# Patient Record
Sex: Female | Born: 1975 | Race: White | Hispanic: No | Marital: Married | Smoking: Never smoker
Health system: Southern US, Community
[De-identification: ages and names within clinical notes are randomized; demographics above are authoritative.]

## PROBLEM LIST (undated history)

## (undated) DIAGNOSIS — E282 Polycystic ovarian syndrome: Secondary | ICD-10-CM

## (undated) DIAGNOSIS — J45909 Unspecified asthma, uncomplicated: Secondary | ICD-10-CM

## (undated) HISTORY — PX: LAPAROSCOPIC GASTRIC RESTRICTIVE DUODENAL PROCEDURE (DUODENAL SWITCH): SHX6667

---

## 1994-05-24 DIAGNOSIS — O0281 Inappropriate change in quantitative human chorionic gonadotropin (hCG) in early pregnancy: Secondary | ICD-10-CM

## 1995-05-25 DIAGNOSIS — O0281 Inappropriate change in quantitative human chorionic gonadotropin (hCG) in early pregnancy: Secondary | ICD-10-CM

## 2006-11-30 ENCOUNTER — Emergency Department: Payer: Self-pay | Admitting: Emergency Medicine

## 2007-02-13 ENCOUNTER — Ambulatory Visit: Payer: Self-pay | Admitting: Emergency Medicine

## 2007-02-13 ENCOUNTER — Other Ambulatory Visit: Payer: Self-pay

## 2007-12-26 ENCOUNTER — Ambulatory Visit: Payer: Self-pay | Admitting: Internal Medicine

## 2007-12-28 ENCOUNTER — Ambulatory Visit: Payer: Self-pay | Admitting: Internal Medicine

## 2007-12-30 ENCOUNTER — Ambulatory Visit: Payer: Self-pay | Admitting: Internal Medicine

## 2008-01-01 ENCOUNTER — Ambulatory Visit: Payer: Self-pay | Admitting: Internal Medicine

## 2008-01-04 ENCOUNTER — Ambulatory Visit: Payer: Self-pay | Admitting: Internal Medicine

## 2008-01-11 ENCOUNTER — Ambulatory Visit: Payer: Self-pay | Admitting: Family Medicine

## 2008-01-12 ENCOUNTER — Ambulatory Visit: Payer: Self-pay | Admitting: Family Medicine

## 2008-05-26 ENCOUNTER — Ambulatory Visit: Payer: Self-pay | Admitting: Family Medicine

## 2009-07-11 ENCOUNTER — Ambulatory Visit: Payer: Self-pay | Admitting: Internal Medicine

## 2012-05-16 ENCOUNTER — Emergency Department: Payer: Self-pay | Admitting: Internal Medicine

## 2013-06-02 ENCOUNTER — Ambulatory Visit: Payer: Self-pay | Admitting: Family Medicine

## 2013-06-03 LAB — RAPID INFLUENZA A&B ANTIGENS

## 2013-12-21 ENCOUNTER — Ambulatory Visit: Payer: Self-pay | Admitting: Gastroenterology

## 2014-01-09 ENCOUNTER — Ambulatory Visit: Payer: Self-pay | Admitting: Family Medicine

## 2014-05-20 ENCOUNTER — Ambulatory Visit: Payer: Self-pay | Admitting: Gastroenterology

## 2015-05-22 ENCOUNTER — Other Ambulatory Visit: Payer: Self-pay | Admitting: Obstetrics and Gynecology

## 2015-05-22 DIAGNOSIS — Z1231 Encounter for screening mammogram for malignant neoplasm of breast: Secondary | ICD-10-CM

## 2015-05-24 ENCOUNTER — Inpatient Hospital Stay
Admission: EM | Admit: 2015-05-24 | Discharge: 2015-05-26 | DRG: 202 | Disposition: A | Discharge status: Home / Self Care | Payer: BLUE CROSS/BLUE SHIELD | Attending: Internal Medicine | Admitting: Internal Medicine

## 2015-05-24 DIAGNOSIS — R0600 Dyspnea, unspecified: Secondary | ICD-10-CM | POA: Diagnosis present

## 2015-05-24 DIAGNOSIS — Z833 Family history of diabetes mellitus: Secondary | ICD-10-CM

## 2015-05-24 DIAGNOSIS — T50995A Adverse effect of other drugs, medicaments and biological substances, initial encounter: Secondary | ICD-10-CM | POA: Diagnosis present

## 2015-05-24 DIAGNOSIS — J069 Acute upper respiratory infection, unspecified: Secondary | ICD-10-CM | POA: Diagnosis present

## 2015-05-24 DIAGNOSIS — Z882 Allergy status to sulfonamides status: Secondary | ICD-10-CM

## 2015-05-24 DIAGNOSIS — Z9884 Bariatric surgery status: Secondary | ICD-10-CM

## 2015-05-24 DIAGNOSIS — R000 Tachycardia, unspecified: Secondary | ICD-10-CM | POA: Diagnosis present

## 2015-05-24 DIAGNOSIS — R0781 Pleurodynia: Secondary | ICD-10-CM

## 2015-05-24 DIAGNOSIS — Z8249 Family history of ischemic heart disease and other diseases of the circulatory system: Secondary | ICD-10-CM

## 2015-05-24 DIAGNOSIS — R55 Syncope and collapse: Secondary | ICD-10-CM | POA: Diagnosis present

## 2015-05-24 DIAGNOSIS — J4521 Mild intermittent asthma with (acute) exacerbation: Secondary | ICD-10-CM | POA: Diagnosis present

## 2015-05-24 DIAGNOSIS — J45901 Unspecified asthma with (acute) exacerbation: Secondary | ICD-10-CM

## 2015-05-24 DIAGNOSIS — J209 Acute bronchitis, unspecified: Principal | ICD-10-CM | POA: Diagnosis present

## 2015-05-24 DIAGNOSIS — B349 Viral infection, unspecified: Secondary | ICD-10-CM

## 2015-05-24 NOTE — ED Notes (Signed)
Pt presents with c/o SOB x 1 hour.  She states that she has had a productive cough and fever for the past day (Tmax 103).  But an hour ago she developed chest pressure and difficulty breathing.

## 2015-05-25 ENCOUNTER — Emergency Department: Payer: BLUE CROSS/BLUE SHIELD

## 2015-05-25 ENCOUNTER — Inpatient Hospital Stay
Admit: 2015-05-25 | Discharge: 2015-05-25 | Disposition: A | Discharge status: Home / Self Care | Payer: BLUE CROSS/BLUE SHIELD | Attending: Internal Medicine | Admitting: Internal Medicine

## 2015-05-25 DIAGNOSIS — R55 Syncope and collapse: Secondary | ICD-10-CM | POA: Diagnosis present

## 2015-05-25 DIAGNOSIS — J069 Acute upper respiratory infection, unspecified: Secondary | ICD-10-CM | POA: Diagnosis present

## 2015-05-25 DIAGNOSIS — J209 Acute bronchitis, unspecified: Secondary | ICD-10-CM | POA: Diagnosis present

## 2015-05-25 DIAGNOSIS — Z882 Allergy status to sulfonamides status: Secondary | ICD-10-CM | POA: Diagnosis not present

## 2015-05-25 DIAGNOSIS — Z9884 Bariatric surgery status: Secondary | ICD-10-CM | POA: Diagnosis not present

## 2015-05-25 DIAGNOSIS — R000 Tachycardia, unspecified: Secondary | ICD-10-CM | POA: Diagnosis present

## 2015-05-25 DIAGNOSIS — R0600 Dyspnea, unspecified: Secondary | ICD-10-CM | POA: Diagnosis present

## 2015-05-25 DIAGNOSIS — J4521 Mild intermittent asthma with (acute) exacerbation: Secondary | ICD-10-CM | POA: Diagnosis present

## 2015-05-25 DIAGNOSIS — Z833 Family history of diabetes mellitus: Secondary | ICD-10-CM | POA: Diagnosis not present

## 2015-05-25 DIAGNOSIS — Z8249 Family history of ischemic heart disease and other diseases of the circulatory system: Secondary | ICD-10-CM | POA: Diagnosis not present

## 2015-05-25 DIAGNOSIS — T50995A Adverse effect of other drugs, medicaments and biological substances, initial encounter: Secondary | ICD-10-CM | POA: Diagnosis present

## 2015-05-25 DIAGNOSIS — R0781 Pleurodynia: Secondary | ICD-10-CM | POA: Diagnosis present

## 2015-05-25 LAB — COMPREHENSIVE METABOLIC PANEL
ALT: 23 U/L (ref 14–54)
AST: 26 U/L (ref 15–41)
Albumin: 4 g/dL (ref 3.5–5.0)
Alkaline Phosphatase: 107 U/L (ref 38–126)
Anion gap: 10 (ref 5–15)
BILIRUBIN TOTAL: 0.7 mg/dL (ref 0.3–1.2)
BUN: 8 mg/dL (ref 6–20)
CALCIUM: 9.1 mg/dL (ref 8.9–10.3)
CHLORIDE: 106 mmol/L (ref 101–111)
CO2: 26 mmol/L (ref 22–32)
CREATININE: 0.75 mg/dL (ref 0.44–1.00)
Glucose, Bld: 100 mg/dL — ABNORMAL HIGH (ref 65–99)
Potassium: 3.6 mmol/L (ref 3.5–5.1)
Sodium: 142 mmol/L (ref 135–145)
Total Protein: 7.1 g/dL (ref 6.5–8.1)

## 2015-05-25 LAB — CBC
HEMATOCRIT: 43.7 % (ref 35.0–47.0)
Hemoglobin: 14.9 g/dL (ref 12.0–16.0)
MCH: 30.4 pg (ref 26.0–34.0)
MCHC: 34.2 g/dL (ref 32.0–36.0)
MCV: 88.9 fL (ref 80.0–100.0)
Platelets: 208 10*3/uL (ref 150–440)
RBC: 4.91 MIL/uL (ref 3.80–5.20)
RDW: 13.9 % (ref 11.5–14.5)
WBC: 5.8 10*3/uL (ref 3.6–11.0)

## 2015-05-25 LAB — URINALYSIS COMPLETE WITH MICROSCOPIC (ARMC ONLY)
Bilirubin Urine: NEGATIVE
Glucose, UA: NEGATIVE mg/dL
Hgb urine dipstick: NEGATIVE
Nitrite: NEGATIVE
PROTEIN: NEGATIVE mg/dL
Specific Gravity, Urine: 1.017 (ref 1.005–1.030)
pH: 6 (ref 5.0–8.0)

## 2015-05-25 LAB — RAPID INFLUENZA A&B ANTIGENS
Influenza A (ARMC): NEGATIVE
Influenza B (ARMC): NEGATIVE

## 2015-05-25 LAB — POCT PREGNANCY, URINE: PREG TEST UR: NEGATIVE

## 2015-05-25 LAB — TROPONIN I: Troponin I: <0.03 ng/mL (ref <0.031)

## 2015-05-25 LAB — TSH: TSH: 2.067 u[IU]/mL (ref 0.350–4.500)

## 2015-05-25 LAB — BRAIN NATRIURETIC PEPTIDE: B Natriuretic Peptide: 18 pg/mL (ref 0.0–100.0)

## 2015-05-25 LAB — FIBRIN DERIVATIVES D-DIMER (ARMC ONLY): FIBRIN DERIVATIVES D-DIMER (ARMC): 561 — AB (ref 0–499)

## 2015-05-25 MED ORDER — PANTOPRAZOLE SODIUM 40 MG PO TBEC
40.0000 mg | DELAYED_RELEASE_TABLET | Freq: Every day | ORAL | Status: DC
  Administered 2015-05-25 – 2015-05-26 (×2): 40 mg via ORAL
  Filled 2015-05-25 (×2): qty 1

## 2015-05-25 MED ORDER — ACETAMINOPHEN 325 MG PO TABS
650.0000 mg | ORAL_TABLET | Freq: Four times a day (QID) | ORAL | Status: DC | PRN
  Administered 2015-05-25: 650 mg via ORAL
  Filled 2015-05-25: qty 2

## 2015-05-25 MED ORDER — VITAMIN D 1000 UNITS PO TABS
4000.0000 [IU] | ORAL_TABLET | Freq: Every day | ORAL | Status: DC
  Filled 2015-05-25: qty 4

## 2015-05-25 MED ORDER — SENNOSIDES-DOCUSATE SODIUM 8.6-50 MG PO TABS: 1.0000 | ORAL_TABLET | Freq: Every evening | ORAL | Status: DC | PRN

## 2015-05-25 MED ORDER — HYDROCODONE-ACETAMINOPHEN 5-325 MG PO TABS: 1.0000 | ORAL_TABLET | ORAL | Status: DC | PRN

## 2015-05-25 MED ORDER — B-12 2500 MCG PO TABS
2500.0000 ug | ORAL_TABLET | Freq: Every day | ORAL | Status: DC
  Filled 2015-05-25: qty 1

## 2015-05-25 MED ORDER — CALCIUM CITRATE 950 (200 CA) MG PO TABS
1500.0000 mg | ORAL_TABLET | Freq: Every day | ORAL | Status: DC
  Filled 2015-05-25: qty 2

## 2015-05-25 MED ORDER — CHOLECALCIFEROL 25 MCG (1000 UT) PO TABS
6000.0000 [IU] | ORAL_TABLET | Freq: Every day | ORAL | Status: DC
  Filled 2015-05-25: qty 6

## 2015-05-25 MED ORDER — ACETAMINOPHEN 500 MG PO TABS
1000.0000 mg | ORAL_TABLET | Freq: Once | ORAL | Status: AC
  Administered 2015-05-25: 1000 mg via ORAL

## 2015-05-25 MED ORDER — ALUM & MAG HYDROXIDE-SIMETH 200-200-20 MG/5ML PO SUSP: 30.0000 mL | Freq: Four times a day (QID) | ORAL | Status: DC | PRN

## 2015-05-25 MED ORDER — AZITHROMYCIN 250 MG PO TABS: 250.0000 mg | ORAL_TABLET | Freq: Every day | ORAL | Status: DC

## 2015-05-25 MED ORDER — PHYTONADIONE 5 MG PO TABS
5.0000 mg | ORAL_TABLET | Freq: Every day | ORAL | Status: DC
  Filled 2015-05-25: qty 1

## 2015-05-25 MED ORDER — FOLIC ACID 1 MG PO TABS
1.0000 mg | ORAL_TABLET | Freq: Every day | ORAL | Status: DC
  Administered 2015-05-25: 1 mg via ORAL
  Filled 2015-05-25: qty 1

## 2015-05-25 MED ORDER — CALCIUM CITRATE-VITAMIN D 500-400 MG-UNIT PO CHEW
1.0000 | CHEWABLE_TABLET | Freq: Three times a day (TID) | ORAL | Status: DC
  Administered 2015-05-25: 1 via ORAL
  Filled 2015-05-25 (×4): qty 1

## 2015-05-25 MED ORDER — ALBUTEROL SULFATE (2.5 MG/3ML) 0.083% IN NEBU: 3.0000 mL | INHALATION_SOLUTION | Freq: Four times a day (QID) | RESPIRATORY_TRACT | Status: DC | PRN

## 2015-05-25 MED ORDER — ACETAMINOPHEN 650 MG RE SUPP: 650.0000 mg | Freq: Four times a day (QID) | RECTAL | Status: DC | PRN

## 2015-05-25 MED ORDER — IPRATROPIUM-ALBUTEROL 0.5-2.5 (3) MG/3ML IN SOLN
RESPIRATORY_TRACT | Status: AC
  Administered 2015-05-25: 3 mL via RESPIRATORY_TRACT
  Filled 2015-05-25: qty 6

## 2015-05-25 MED ORDER — ALBUTEROL SULFATE (2.5 MG/3ML) 0.083% IN NEBU: 5.0000 mg | INHALATION_SOLUTION | Freq: Once | RESPIRATORY_TRACT | Status: DC

## 2015-05-25 MED ORDER — ONDANSETRON HCL 4 MG/2ML IJ SOLN
4.0000 mg | Freq: Once | INTRAMUSCULAR | Status: AC
  Administered 2015-05-25: 4 mg via INTRAVENOUS
  Filled 2015-05-25: qty 2

## 2015-05-25 MED ORDER — PREDNISONE 20 MG PO TABS
40.0000 mg | ORAL_TABLET | Freq: Once | ORAL | Status: AC
  Administered 2015-05-25: 40 mg via ORAL
  Filled 2015-05-25: qty 2

## 2015-05-25 MED ORDER — ONDANSETRON HCL 4 MG PO TABS
4.0000 mg | ORAL_TABLET | Freq: Four times a day (QID) | ORAL | Status: DC | PRN
Start: 2015-05-25 — End: 2015-05-26

## 2015-05-25 MED ORDER — IPRATROPIUM-ALBUTEROL 0.5-2.5 (3) MG/3ML IN SOLN
3.0000 mL | Freq: Once | RESPIRATORY_TRACT | Status: AC
  Administered 2015-05-25: 3 mL via RESPIRATORY_TRACT
  Filled 2015-05-25: qty 3

## 2015-05-25 MED ORDER — ACETAMINOPHEN 500 MG PO TABS
ORAL_TABLET | ORAL | Status: AC
  Filled 2015-05-25: qty 2

## 2015-05-25 MED ORDER — IOHEXOL 350 MG/ML SOLN
75.0000 mL | Freq: Once | INTRAVENOUS | Status: AC | PRN
  Administered 2015-05-25: 75 mL via INTRAVENOUS

## 2015-05-25 MED ORDER — METHYLPREDNISOLONE SODIUM SUCC 40 MG IJ SOLR
40.0000 mg | Freq: Two times a day (BID) | INTRAMUSCULAR | Status: DC
  Administered 2015-05-25 – 2015-05-26 (×3): 40 mg via INTRAVENOUS
  Filled 2015-05-25 (×3): qty 1

## 2015-05-25 MED ORDER — ONDANSETRON HCL 4 MG/2ML IJ SOLN: 4.0000 mg | Freq: Four times a day (QID) | INTRAMUSCULAR | Status: DC | PRN

## 2015-05-25 MED ORDER — AZITHROMYCIN 250 MG PO TABS: 500.0000 mg | ORAL_TABLET | Freq: Once | ORAL | Status: DC

## 2015-05-25 MED ORDER — SODIUM CHLORIDE 0.9 % IV SOLN
INTRAVENOUS | Status: DC
  Administered 2015-05-25 – 2015-05-26 (×3): via INTRAVENOUS

## 2015-05-25 MED ORDER — SODIUM CHLORIDE 0.9 % IJ SOLN
3.0000 mL | Freq: Two times a day (BID) | INTRAMUSCULAR | Status: DC
  Administered 2015-05-25: 3 mL via INTRAVENOUS

## 2015-05-25 MED ORDER — VITAMIN B-12 1000 MCG PO TABS
2000.0000 ug | ORAL_TABLET | Freq: Every day | ORAL | Status: DC
  Administered 2015-05-25: 2000 ug via ORAL
  Filled 2015-05-25: qty 2

## 2015-05-25 MED ORDER — IPRATROPIUM-ALBUTEROL 0.5-2.5 (3) MG/3ML IN SOLN
3.0000 mL | Freq: Once | RESPIRATORY_TRACT | Status: AC
  Administered 2015-05-25: 3 mL via RESPIRATORY_TRACT

## 2015-05-25 MED ORDER — BENZONATATE 100 MG PO CAPS
ORAL_CAPSULE | ORAL | Status: AC
  Filled 2015-05-25: qty 1

## 2015-05-25 MED ORDER — ENOXAPARIN SODIUM 40 MG/0.4ML ~~LOC~~ SOLN
40.0000 mg | Freq: Two times a day (BID) | SUBCUTANEOUS | Status: DC
  Administered 2015-05-25: 40 mg via SUBCUTANEOUS
  Filled 2015-05-25 (×2): qty 0.4

## 2015-05-25 MED ORDER — ADULT MULTIVITAMIN W/MINERALS CH
2.0000 | ORAL_TABLET | Freq: Every day | ORAL | Status: DC
  Administered 2015-05-25: 2 via ORAL
  Filled 2015-05-25: qty 2

## 2015-05-25 MED ORDER — BENZONATATE 100 MG PO CAPS
100.0000 mg | ORAL_CAPSULE | ORAL | Status: AC
  Administered 2015-05-25: 100 mg via ORAL
  Filled 2015-05-25: qty 1

## 2015-05-25 MED ORDER — AZITHROMYCIN 250 MG PO TABS
500.0000 mg | ORAL_TABLET | Freq: Once | ORAL | Status: AC
  Administered 2015-05-25: 500 mg via ORAL
  Filled 2015-05-25: qty 2

## 2015-05-25 MED ORDER — CALCIUM CITRATE-VITAMIN D 500-400 MG-UNIT PO CHEW
3.0000 | CHEWABLE_TABLET | Freq: Every day | ORAL | Status: DC
Start: 2015-05-25 — End: 2015-05-25
  Administered 2015-05-25: 1 via ORAL
  Filled 2015-05-25: qty 3

## 2015-05-25 MED ORDER — ACETAMINOPHEN 325 MG PO TABS: 650.0000 mg | ORAL_TABLET | Freq: Once | ORAL | Status: DC

## 2015-05-25 MED ORDER — MORPHINE SULFATE (PF) 4 MG/ML IV SOLN
4.0000 mg | Freq: Once | INTRAVENOUS | Status: AC
  Administered 2015-05-25: 4 mg via INTRAVENOUS
  Filled 2015-05-25: qty 1

## 2015-05-25 MED ORDER — VITAMIN A 10000 UNITS PO CAPS: 10000.0000 [IU] | ORAL_CAPSULE | Freq: Every day | ORAL | Status: DC

## 2015-05-25 MED ORDER — SODIUM CHLORIDE 0.9 % IV BOLUS (SEPSIS)
1000.0000 mL | Freq: Once | INTRAVENOUS | Status: AC
  Administered 2015-05-25: 1000 mL via INTRAVENOUS
  Filled 2015-05-25: qty 1000

## 2015-05-25 MED ORDER — PREDNISONE 20 MG PO TABS
ORAL_TABLET | ORAL | Status: AC
  Filled 2015-05-25: qty 2

## 2015-05-25 NOTE — ED Notes (Addendum)
Ambulated pt with pulse oximetry, and cardiac monitors attached. Pt's O2 sats stayed above 94%, however pt's respirations increased to 30+/min, and pt's HR increased to 130+bpm.  Pt became dizzy/lightheaded, and began to have poor gait while ambulating. MD notified.

## 2015-05-25 NOTE — Progress Notes (Signed)
*  PRELIMINARY RESULTS* Echocardiogram 2D Echocardiogram has been performed.  Angel Bowers 05/25/2015, 10:11 AM

## 2015-05-25 NOTE — ED Provider Notes (Addendum)
[facility] Emergency Department Provider Note  REMINDER - THIS NOTE IS NOT A FINAL MEDICAL RECORD UNTIL IT IS SIGNED. UNTIL THEN, THE CONTENT BELOW MAY REFLECT INFORMATION FROM A DOCUMENTATION TEMPLATE, NOT THE ACTUAL PATIENT VISIT. ____________________________________________  Time seen: Approximately 12:39 AM  I have reviewed the triage vital signs and the nursing notes.   HISTORY  Chief Complaint Shortness of Breath    HPI Angel Bowers is a 40 y.o. female history of previous bariatric surgery and asthma. She tells me that her asthma symptoms have gone away and she has not had any recent issues.  For the last day she's been having a dry, and occasionally productive cough. She reports that she felt slightly short of breath earlier today, noticing it worse feeling "congested" when she lays back. She did report having a fever to 103 today. No abdominal pain, no nausea or vomiting. She has not had any chest pain or discomfort until about one hour to prior arrival.  Patient went to bed, she was laying down and states that she suddenly woke up feeling short of breath with pain whenever she took a deep breath. She reports a sharp somewhat uncomfortable pain across the mid chest, worse with coughing and also associated with shortness of breath and feeling very anxious just about one hour prior to arrival.  She denies any heavy chest pressure, she states doesn't sound like a "heart attack".  No estrogen use. No smoking. No recent travel. No surgery in the last 3 months. No history of blood clots. Denies leg swelling.   History reviewed. No pertinent past medical history.  There are no active problems to display for this patient.   Past Surgical History  Procedure Laterality Date  . 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 Calcium Citrate 200 MG TABS   Oral    Take 200 mg by mouth daily.         . Cholecalciferol (VITAMIN D-1000 MAX ST) 1000 units tablet   Oral   Take 6,000 Units by mouth daily.         . Cyanocobalamin (B-12) 2500 MCG TABS   Oral   Take 2,500 mg by mouth daily.         . folic acid (FOLVITE) 1 MG tablet   Oral   Take 1 mg by mouth daily.         . pantoprazole (PROTONIX) 40 MG tablet   Oral   Take 40 mg by mouth daily.         . phytonadione (VITAMIN K) 5 MG tablet   Oral   Take 5 mg by mouth daily.         . vitamin A ([employer] VITAMIN A) 10000 UNIT capsule   Oral   Take 10,000 Units by mouth daily.           Allergies Sulfa antibiotics  No family history on file.  Social History Social History  Substance Use Topics  . Smoking status: Never Smoker   . Smokeless tobacco: None  . Alcohol Use: No    Review of Systems Constitutional:  see history of present illnessyes: No visual changes. ENT:  slightly scratchy, but denies sore throat or trouble swallowing. Cardiovascular:  Gershon Cull reports only having pain in his discomfort with a slight sharp component when she takes a deep breath. She denies having pain  when taking normal breast. No radiating pain. No heavy chest pressure. Associated with any sweating. No nausea or vomiting. Respiratory:  states she felt very short of breath at home, this is improved some. She denies wheezing. She reports she has a history of asthma, but this did not feel anything like that in the past. At the present time her breathing seems to be somewhat better, though she does still feel the mild shortness of breath. She reports feeling "congested". Gastrointestinal: No abdominal pain.  No nausea, no vomiting.  No diarrhea.  No constipation. Genitourinary: Negative for dysuria. Musculoskeletal: Negative for back pain. she's had some slight muscle aches for the last day.  Skin: Negative for rash. Neurological: Negative for headaches, focal weakness or numbness.  10-point  ROS otherwise negative.  ____________________________________________   PHYSICAL EXAM:  VITAL SIGNS: ED Triage Vitals  Enc Vitals Group     BP --      Pulse Rate 05/24/15 2352 121     Resp 05/24/15 2352 26     Temp 05/24/15 2352 99.5 F (37.5 C)     Temp Source 05/24/15 2352 Oral     SpO2 05/24/15 2352 96 %     Weight 05/24/15 2352 259 lb (117.482 kg)     Height 05/24/15 2352 5\' 1"  (1.549 m)     Head Cir --      Peak Flow --      Pain Score 05/24/15 2353 5     Pain Loc --      Pain Edu? --      Excl. in GC? --    Constitutional: Alert and oriented.  anxious with moderate increased work of breathing. Sitting upright in bed. Eyes: Conjunctivae are normal. PERRL. EOMI. Head: Atraumatic. Nose:  slight clear rhinorrhea. Mouth/Throat: Mucous membranes are moist.  Oropharynx non-erythematous. Neck: No stridor.   Cardiovascular: Normal rate at 90 , regular rhythm. Grossly normal heart sounds.  Good peripheral circulation. Mild tachypnea. No wheezing no retractions. She is able to speak in full sentences. No retractions. Lungs CTAB. Gastrointestinal: Soft and nontender. No distention. No abdominal bruits. No CVA tenderness. Musculoskeletal: No lower extremity tenderness nor edema.  No joint effusions. Neurologic:  Normal speech and language. No gross focal neurologic deficits are appreciated.Skin:  Skin is warm, dry and intact. No rash noted. Psychiatric: Mood and affect are normal. Speech and behavior are normal.  ____________________________________________   LABS (all labs ordered are listed, but only abnormal results are displayed)  Labs Reviewed  FIBRIN DERIVATIVES D-DIMER ([facility] ONLY) - Abnormal; Notable for the following:    Fibrin derivatives D-dimer ([facility]) 561 (*)    All other components within normal limits  COMPREHENSIVE METABOLIC PANEL - Abnormal; Notable for the following:    Glucose, Bld 100 (*)    All other components within normal limits  URINALYSIS  COMPLETEWITH MICROSCOPIC ([facility] ONLY) - Abnormal; Notable for the following:    Color, Urine YELLOW (*)    APPearance HAZY (*)    Ketones, ur TRACE (*)    Leukocytes, UA TRACE (*)    Bacteria, UA RARE (*)    Squamous Epithelial / LPF 6-30 (*)    All other components within normal limits  RAPID INFLUENZA A&B ANTIGENS ([facility] ONLY)  BRAIN NATRIURETIC PEPTIDE  TROPONIN I  CBC  TROPONIN I  POC URINE PREG, ED  POCT PREGNANCY, URINE   ____________________________________________  EKG  ED ECG REPORT I, Favour Aleshire, the attending physician, personally viewed and interpreted this ECG.  Date: 05/24/15 EKG Time: 2359 Rate: 95 Rhythm: normal sinus rhythm QRS Axis: normal Intervals: normal ST/T Wave abnormalities: normal Conduction Disutrbances: none Narrative Interpretation: There is notable artifact, but I do not see any acute abnormality. Plan to repeat once the patient breathing is calm her.  Date: 05/25/2015 EKG Time: 0030 Rate: 90 Rhythm: normal sinus rhythm QRS Axis: normal Intervals: normal ST/T Wave abnormalities: normal Conduction Disutrbances: none Narrative Interpretation: unremarkable  ____________________________________________  RADIOLOGY   CT Angio Chest PE W/Cm &/Or Wo Cm (Final result) Result time: 05/25/15 03:18:39   Final result by Rad Results In Interface (05/25/15 03:18:39)   Narrative:   CLINICAL DATA: Acute onset of shortness of breath, productive cough and fever. Chest pressure and difficulty breathing. Initial encounter.  EXAM: CT ANGIOGRAPHY CHEST WITH CONTRAST  TECHNIQUE: Multidetector CT imaging of the chest was performed using the standard protocol during bolus administration of intravenous contrast. Multiplanar CT image reconstructions and MIPs were obtained to evaluate the vascular anatomy.  CONTRAST: 75mL OMNIPAQUE IOHEXOL 350 MG/ML SOLN  COMPARISON: Chest radiograph from 05/25/2015  FINDINGS: There is no evidence of  pulmonary embolus.  The lungs are essentially clear bilaterally. There is no evidence of significant focal consolidation, pleural effusion or pneumothorax. No masses are identified; no abnormal focal contrast enhancement is seen.  The mediastinum is unremarkable in appearance. No mediastinal lymphadenopathy is seen. No pericardial effusion is identified. The great vessels are grossly unremarkable in appearance. Mild coronary artery calcification is noted, about the proximal left anterior descending artery. No axillary lymphadenopathy is seen. The visualized portions of the thyroid gland are unremarkable in appearance.  The visualized portions of the liver and spleen are unremarkable. Postoperative change is noted about the stomach. The visualized portions of the pancreas, adrenal glands and kidneys are grossly unremarkable.  No acute osseous abnormalities are seen.  Review of the MIP images confirms the above findings.  IMPRESSION: 1. No evidence of pulmonary embolus. 2. Lungs essentially clear bilaterally. 3. Mild coronary artery calcification noted, about the proximal left anterior descending artery.   Electronically Signed By: Roanna RaiderJeffery Chang M.D. On: 05/25/2015 03:18       ____________________________________________   PROCEDURES  Procedure(s) performed: None  Critical Care performed: No  ____________________________________________   INITIAL IMPRESSION / ASSESSMENT AND PLAN / ED COURSE  Pertinent labs & imaging results that were available during my care of the patient were reviewed by me and considered in my medical decision making (see chart for details).  Patient reports congestion, upper respiratory symptoms for about the last day with a fever to 103. She does report she had a flu shot this year. She has a occasionally productive cough. Her lung exam is clear, though she is tachypneic and anxious appearing. Appears likely infectious, but given her  previous history and associated somewhat pleuritic chest pain I will add a d-dimer. She is low risk by well's criteria. I do not believe pulmonary wasn't the most likely cause given her upper respiratory symptoms and would feel pleurisy, upper respiratory infection, pneumonia to be more likely than pulmonary embolus a more acute cardiac syndrome. Chest pain is very atypical ACS and she has no significant ACS risk factors other than obesity. EKG reassuring.  ----------------------------------------- 12:48 AM on 05/25/2015 -----------------------------------------  Patient reports symptoms are much better. Currently resting comfortably. Lungs clear to auscultation bilaterally. No wheezing or rales and reexam. Currently respiratory rate 18, satting 100% on room air. Overall appears much improved.  ----------------------------------------- 5:17 AM on 05/25/2015 -----------------------------------------  Updated  patient on lab results, nothing to indicate an acute coronary syndrome, pulmonary embolism, pneumonia. Influenza test negative. At this point appears the patient likely has bronchitis versus viral upper respiratory symptoms. Presently no indication for antibiotic treatment, we'll treat with an inhaler, prednisone, and prescription cough medication.  Patient alert, no distress but on repeat exam does have some very mild end expiratory wheezing. She does have a history of asthma, and I'll treat her with a DuoNeb and initiate prednisone here prior to discharge. We'll also trended trending pulse ox her to discharge, to assure that she is not desaturating with ambulation. She reports overall that she is feeling improvement. Awake alert speaking in full and normal sentences at this time. Oxygen saturation 95-99% on room air.  With ambulation for trending pulse ox the patient's heart rate rose to 140, she became acutely tachypnea and reports shortness of breath inability to ambulate more than about 20  feet without significant dyspnea. When back in a better heart rate improved however she continues to note ongoing dyspnea. At this point after hydration, DuoNeb abs and prednisone with her noted fever to 103.2 and significant tachycardia we will admit the patient to hospital service. I suspect a viral syndrome, however I'm concerned about her inability to ablate without significant dyspnea and severe tachycardia.  Discussed with the hospitalist that, at the present time I do not have a clear single diagnosis and the patient suspects is likely multifactorial including viral illness/febrile illness along with reactive airways disease. Again I suspect she has some reactive airway disease with associated viral syndrome, but consideration for other causes is certainly made on this is my provisional diagnosis. Patient agreeable with plan for admission. ____________________________________________   FINAL CLINICAL IMPRESSION(S) / ED DIAGNOSES  Final diagnoses:  Pleuritic pain  Viral syndrome  Asthma exacerbation    Sharyn Creamer, MD 05/25/15 1610  ----------------------------------------- 7:26 AM on 05/25/2015 -----------------------------------------  Patient in the room with her husband, he reports that she was getting ready to sit up when she suddenly passed out and fell out of the bed. She woke up immediately on the floor, she denies any headache neck pain or injury. Patient was log rolled on a spine board and that she back in the bed. She never lost her pulse but did bradycardia down to about 60 during the episode. She is awake and alert now. Normocephalic atraumatic, no cervical spine tenderness. No apparent injury from a fall, however she is now on fall precautions. Discussed with the patient, and she is agreeable. Also discussed with Dr. Aleen Campi service.  Sharyn Creamer, MD 05/25/15 [phone]

## 2015-05-25 NOTE — Progress Notes (Signed)
Angel Bowers is a 40 y.o. female patient admitted from ED awake, alert - oriented  X 4 - no acute distress noted.  VSS - Blood pressure 104/49, pulse 93, temperature 99.8 F (37.7 C), temperature source Oral, resp. rate 18, height 5\' 1"  (1.549 m), weight 118.706 kg (261 lb 11.2 oz), last menstrual period 04/07/2015, SpO2 99 %.    IV in place, occlusive dsg intact without redness.  Orientation to room, and floor completed with information packet given to patient/family.  Admission INP armband ID verified with patient/family, and in place.   SR up x 2, fall assessment complete, with patient and family able to verbalize understanding of risk associated with falls, and verbalized understanding to call nsg before up out of bed.  Call light within reach, patient able to voice, and demonstrate understanding.  Skin, clean-dry- intact without evidence of bruising, or skin tears.   No evidence of skin break down noted on exam. Skin assessed with Crystal M. Tele box verified.      Will cont to eval and treat per MD orders.  Rudean Haskellonnisha R Diara Chaudhari, RN 05/25/2015 11:08 AM

## 2015-05-25 NOTE — ED Notes (Signed)
Pt's o2 sats dropping while pt is asleep, put on 2L oxygen nasal cannula

## 2015-05-25 NOTE — Progress Notes (Signed)
Patient has orders for enoxaparin 40mg  SQ q24hrs for DVT prophylaxis. Due to patient's BMI>40 kg/m2 and CrCl >30 ml/min, patient's dose should be enoxaparin 40mg  SQ BID. Body mass index is 49.47 kg/(m^2). Estimated Creatinine Clearance: 111.3 mL/min (by C-G formula based on Cr of 0.75).  Lovenox order adjusted accordingly.   Roque CashAllison Libi Corso, PharmD Pharmacy Resident 05/25/2015

## 2015-05-25 NOTE — ED Notes (Signed)
Patient transported to X-ray 

## 2015-05-25 NOTE — ED Notes (Signed)
Patient transported to CT 

## 2015-05-25 NOTE — ED Notes (Signed)
Ambulated pt with pulse ox/cardiac monitor. Pt became dizzy, had a decline in quality of gait. Pt's HR increased to 142, RR increased to 41. Pt's O2 sats stayed at 97%. MD notified

## 2015-05-25 NOTE — H&P (Signed)
[facility] - [place] at [facility]   PATIENT NAME: Angel Bowers    MR#:  [id number]  DATE OF BIRTH:  April 23, 1976  DATE OF ADMISSION:  05/24/2015  PRIMARY CARE PHYSICIAN: Rolm Gala, MD   REQUESTING/REFERRING PHYSICIAN: Dr Fanny Bien  CHIEF COMPLAINT:   Shortness of breath and fever HISTORY OF PRESENT ILLNESS:  Angel Bowers  is a 40 y.o. female with a known history of gastric bypass and asthma who presents with above complaint. Patient reports that this morning she had a fever of over 103 at home along with shortness of breath. She has been feeling congested over the past 24 hours. This morning she also felt very faint and almost passed out. She wrapped to the ER due to fever and shortness of breath. In the emergency room she is also complaining of chest tightness. Troponins 2 were negative.  for shortness of breath it was noted that she had some mild wheezing and was therefore given DuoNeb's 3. Patient was going to be discharged however she remained in sinus tachycardia. Subsequently patient had another fainting spell as she was trying to get up. Her heart rate was around the 60s when this happened.    past medical history:   Bariatric surgery Asthma  PAST SURGICAL HISTORY:   Past Surgical History  Procedure Laterality Date  . Laparoscopic gastric restrictive duodenal procedure (duodenal switch)      SOCIAL HISTORY:   Social History  Substance Use Topics  . Smoking status: Never Smoker   . Smokeless tobacco: no  . Alcohol Use: No    FAMILY HISTORY:  Diabetes and hypertension   DRUG ALLERGIES:   Allergies  Allergen Reactions  . Sulfa Antibiotics Anaphylaxis     REVIEW OF SYSTEMS:  CONSTITUTIONAL: No fever,  positive fatigue fatigue  and weakness.  EYES: No blurred or double vision.  EARS, NOSE, AND THROAT: No tinnitus or ear pain.  RESPIRATORY:  positive cough cough, shortness of breath, wheezing no  hemoptysis.  CARDIOVASCULAR:  No chest pain, orthopnea, edema.  GASTROINTESTINAL: No nausea, vomiting, diarrhea or abdominal pain.  GENITOURINARY: No dysuria, hematuria.  ENDOCRINE: No polyuria, nocturia,  HEMATOLOGY: No anemia, easy bruising or bleeding SKIN: No rash or lesion. MUSCULOSKELETAL: No joint pain or arthritis.   NEUROLOGIC: No tingling, numbness, weakness.  PSYCHIATRY: No anxiety or depression.   MEDICATIONS AT HOME:   Prior to Admission medications   Medication Sig Start Date End Date Taking? Authorizing Provider  Calcium Citrate 200 MG TABS Take 200 mg by mouth daily.    Yes Historical Provider, MD  Cholecalciferol (VITAMIN D-1000 MAX ST) 1000 units tablet Take 6,000 Units by mouth daily.   Yes Historical Provider, MD  Cyanocobalamin (B-12) 2500 MCG TABS Take 2,500 mg by mouth daily.    Yes Historical Provider, MD  folic acid (FOLVITE) 1 MG tablet Take 1 mg by mouth daily.   Yes Historical Provider, MD  pantoprazole (PROTONIX) 40 MG tablet Take 40 mg by mouth daily.   Yes Historical Provider, MD  phytonadione (VITAMIN K) 5 MG tablet Take 5 mg by mouth daily.   Yes Historical Provider, MD  vitamin A (CVS VITAMIN A) 10000 UNIT capsule Take 10,000 Units by mouth daily.   Yes Historical Provider, MD      VITAL SIGNS:  Blood pressure 104/49, pulse 93, temperature 99.8 F (37.7 C), temperature source Oral, resp. rate 18, height 5\' 1"  (1.549 m), weight 118.706 kg (261 lb 11.2 oz), last menstrual period 04/07/2015, SpO2  99 %.  PHYSICAL EXAMINATION:  GENERAL:  40 y.o.-year-old patient lying in the bed with no acute distress.  EYES: Pupils equal, round, reactive to light and accommodation. No scleral icterus. Extraocular muscles intact.  HEENT: Head atraumatic, normocephalic. Oropharynx and nasopharynx clear.  NECK:  Supple, no jugular venous distention. No thyroid enlargement, no tenderness.  LUNGS: mild wheezing with good air movement bilaterally. No crackles or rales heard. No increased work of breathing.    CARDIOVASCULAR: S1, S2 normal. No murmurs, rubs, or gallops.  ABDOMEN: Soft, nontender, nondistended. Bowel sounds present. No organomegaly or mass.  EXTREMITIES: No pedal edema, cyanosis, or clubbing.  NEUROLOGIC: Cranial nerves II through XII are grossly intact. No focal deficits. PSYCHIATRIC: The patient is alert and oriented x 3.  SKIN: No obvious rash, lesion, or ulcer.   LABORATORY PANEL:   CBC  Recent Labs Lab 05/24/15 0230  WBC 5.8  HGB 14.9  HCT 43.7  PLT 208   ------------------------------------------------------------------------------------------------------------------  Chemistries   Recent Labs Lab 05/24/15 0230  NA 142  K 3.6  CL 106  CO2 26  GLUCOSE 100*  BUN 8  CREATININE 0.75  CALCIUM 9.1  AST 26  ALT 23  ALKPHOS 107  BILITOT 0.7   ------------------------------------------------------------------------------------------------------------------  Cardiac Enzymes  Recent Labs Lab 05/24/15 0100 05/25/15 0410  TROPONINI <0.03 <0.03   ------------------------------------------------------------------------------------------------------------------  RADIOLOGY:  Dg Chest 2 View  05/25/2015  CLINICAL DATA:  Acute onset of shortness of breath, productive cough and fever. Chest pressure and difficulty breathing. Initial encounter. EXAM: CHEST  2 VIEW COMPARISON:  Chest radiograph performed 02/13/2007 FINDINGS: The lungs are well-aerated and clear. There is no evidence of focal opacification, pleural effusion or pneumothorax. The heart is normal in size; the mediastinal contour is within normal limits. No acute osseous abnormalities are seen. IMPRESSION: No acute cardiopulmonary process seen. Electronically Signed   By: Roanna Raider M.D.   On: 05/25/2015 01:15   Ct Angio Chest Pe W/cm &/or Wo Cm  05/25/2015  CLINICAL DATA:  Acute onset of shortness of breath, productive cough and fever. Chest pressure and difficulty breathing. Initial encounter.  EXAM: CT ANGIOGRAPHY CHEST WITH CONTRAST TECHNIQUE: Multidetector CT imaging of the chest was performed using the standard protocol during bolus administration of intravenous contrast. Multiplanar CT image reconstructions and MIPs were obtained to evaluate the vascular anatomy. CONTRAST:  75mL OMNIPAQUE IOHEXOL 350 MG/ML SOLN COMPARISON:  Chest radiograph from 05/25/2015 FINDINGS: There is no evidence of pulmonary embolus. The lungs are essentially clear bilaterally. There is no evidence of significant focal consolidation, pleural effusion or pneumothorax. No masses are identified; no abnormal focal contrast enhancement is seen. The mediastinum is unremarkable in appearance. No mediastinal lymphadenopathy is seen. No pericardial effusion is identified. The great vessels are grossly unremarkable in appearance. Mild coronary artery calcification is noted, about the proximal left anterior descending artery. No axillary lymphadenopathy is seen. The visualized portions of the thyroid gland are unremarkable in appearance. The visualized portions of the liver and spleen are unremarkable. Postoperative change is noted about the stomach. The visualized portions of the pancreas, adrenal glands and kidneys are grossly unremarkable. No acute osseous abnormalities are seen. Review of the MIP images confirms the above findings. IMPRESSION: 1. No evidence of pulmonary embolus. 2. Lungs essentially clear bilaterally. 3. Mild coronary artery calcification noted, about the proximal left anterior descending artery. Electronically Signed   By: Roanna Raider M.D.   On: 05/25/2015 03:18    EKG:   Sinus tachycardia  IMPRESSION AND PLAN:   40 year old female with a history of asthma who presents with shortness of breath and fever.    1. Dyspnea: This is likely due to mild asthma exacerbation. CT scan of the chest was negative for PE or pulmonary edema. Patient will be placed on IV steroids and azithromycin. Continue nebulizers  when necessary.   flu was negative.  2. Vasovagal syncope: This is secondary to asthma exacerbation/URI. Continue telemetry. Continue to monitor troponins. 2-D echocardiogram has been ordered. I have also ordered orthostatic vitals.  3. Status post bariatric surgery March 2016: Continue outpatient medications.    All the records are reviewed and case discussed with ED provider. Management plans discussed with the patient and  She is in agreement.  CODE STATUS:Full  Total  TIME TAKING CARE OF THIS PATIENT: 45 minutes.    Alyla Pietila M.D on 05/25/2015 at 9:44 AM  Between 7am to 6pm - Pager - [phone] After 6pm go to [email or web address] - password [email or web address]  [facility]  Office  [phone]  CC: Primary care physician; Rolm GalaGRANDIS, HEIDI, MD

## 2015-05-26 MED ORDER — AZITHROMYCIN 250 MG PO TABS: ORAL_TABLET | ORAL | Status: AC

## 2015-05-26 MED ORDER — PREDNISONE 10 MG PO TABS: 10.0000 mg | ORAL_TABLET | Freq: Every day | ORAL | Status: AC

## 2015-05-26 NOTE — Progress Notes (Signed)
Patient received discharge instructions, pt verbalized understanding. IV was removed with no signs of infection. Dressing clean, dry intact. No skin tears or wounds present. Prescription was printed and given to patient. Patient was escorted out with staff member via wheelchair via private auto. No further needs from care management team.  

## 2015-05-26 NOTE — Care Management (Signed)
Review of medical record and discussion with team members- do not identify any discharge needs.

## 2015-05-26 NOTE — Discharge Summary (Signed)
[facility] - [place] at [facility]   PATIENT NAME: Angel Bowers    MR#:  [id number]  DATE OF BIRTH:  [birth year]  DATE OF ADMISSION:  05/24/2015 ADMITTING PHYSICIAN: Adrian Saran, MD  DATE OF DISCHARGE: 05/26/2015  9:34 AM  PRIMARY CARE PHYSICIAN: Rolm Gala, MD    ADMISSION DIAGNOSIS:  Pleuritic pain [R07.81] Viral syndrome [B34.9] Asthma exacerbation [J45.901]  DISCHARGE DIAGNOSIS:  Active Problems:   Dyspnea   SECONDARY DIAGNOSIS:  History reviewed. No pertinent past medical history.  HOSPITAL COURSE:  This is a very pleasant 40 year old female with a history of gastric bypass surgery and asthma who presented with fever and shortness of breath. For further details please further H&P.   1. Shortness of breath: This is likely due to bronchospasm from acute bronchitis. CT can of chest on admission was negative for PE. Chest x-ray showed no evidence of pneumonia or infiltrate. Patient was on IV steroids and azithromycin. Patient's shortness of breath improved. 2-D echocardiogram showed normal ejection fractions with some very minimal diastolic dysfunction.  2.Vasovagal syncope: This is secondary to upper respiratory infection and acute bronchitis. Telemetry showed no acute arrhythmias. Orthostatic vitals were negative.  3. Sinus tachycardia: This is secondary to nebulizer treatments as patient received in the emergency room.  4. Status post bariatric surgery in March 2016: Patient continue outpatient medications. 5. Mild intermittent asthma: Patient with without exacerbation. DISCHARGE CONDITIONS AND DIET:  Patient stable for discharge home on a heart healthy diet  CONSULTS OBTAINED:     DRUG ALLERGIES:   Allergies  Allergen Reactions  . Sulfa Antibiotics Anaphylaxis    DISCHARGE MEDICATIONS:   Discharge Medication List as of 05/26/2015  9:12 AM    START taking these medications   Details  azithromycin (ZITHROMAX) 250 MG tablet 1  tablet daily for 3 days, Normal    predniSONE (DELTASONE) 10 MG tablet Take 1 tablet (10 mg total) by mouth daily with breakfast., Starting 05/26/2015, Until Discontinued, Print      CONTINUE these medications which have NOT CHANGED   Details  Biotin 10 MG TABS Take 10 mg by mouth daily., Until Discontinued, Historical Med    Calcium Citrate 200 MG TABS Take 1,500 mg by mouth daily. , Until Discontinued, Historical Med    Cholecalciferol (VITAMIN D-1000 MAX ST) 1000 units tablet Take 6,000 Units by mouth daily., Until Discontinued, Historical Med    Cyanocobalamin (B-12) 2500 MCG TABS Take 2,000 mcg by mouth daily. , Until Discontinued, Historical Med    folic acid (FOLVITE) 1 MG tablet Take 1 mg by mouth daily., Until Discontinued, Historical Med    Multiple Vitamin (MULTIVITAMIN) tablet Take 2 tablets by mouth daily., Until Discontinued, Historical Med    pantoprazole (PROTONIX) 40 MG tablet Take 40 mg by mouth daily., Until Discontinued, Historical Med    Phytonadione 100 MCG TABS Take 500 mcg by mouth daily., Until Discontinued, Historical Med    vitamin A (CVS VITAMIN A) 10000 UNIT capsule Take 10,000 Units by mouth daily., Until Discontinued, Historical Med              Today   CHIEF COMPLAINT:  Patient is doing well this morning. Denies shortness of breath or palpitations.   VITAL SIGNS:  Blood pressure 132/71, pulse 50, temperature 98 F (36.7 C), temperature source Oral, resp. rate 16, height 5\' 1"  (1.549 m), weight 118.706 kg (261 lb 11.2 oz), last menstrual period 04/07/2015, SpO2 97 %.   REVIEW OF SYSTEMS:  Review of Systems  Constitutional: Negative for fever, chills and malaise/fatigue.  HENT: Negative for sore throat.   Eyes: Negative for blurred vision.  Respiratory: Negative for cough, hemoptysis, shortness of breath and wheezing.   Cardiovascular: Negative for chest pain, palpitations and leg swelling.  Gastrointestinal: Negative for nausea,  vomiting, abdominal pain, diarrhea and blood in stool.  Genitourinary: Negative for dysuria.  Musculoskeletal: Negative for back pain.  Neurological: Negative for dizziness, tremors and headaches.  Endo/Heme/Allergies: Does not bruise/bleed easily.     PHYSICAL EXAMINATION:  GENERAL:  40 y.o.-year-old patient lying in the bed with no acute distress.  NECK:  Supple, no jugular venous distention. No thyroid enlargement, no tenderness.  LUNGS: Normal breath sounds bilaterally, no wheezing, rales,rhonchi  No use of accessory muscles of respiration.  CARDIOVASCULAR: S1, S2 normal. No murmurs, rubs, or gallops.  ABDOMEN: Soft, non-tender, non-distended. Bowel sounds present. No organomegaly or mass.  EXTREMITIES: No pedal edema, cyanosis, or clubbing.  PSYCHIATRIC: The patient is alert and oriented x 3.  SKIN: No obvious rash, lesion, or ulcer.   DATA REVIEW:   CBC  Recent Labs Lab 05/24/15 0230  WBC 5.8  HGB 14.9  HCT 43.7  PLT 208    Chemistries   Recent Labs Lab 05/24/15 0230  NA 142  K 3.6  CL 106  CO2 26  GLUCOSE 100*  BUN 8  CREATININE 0.75  CALCIUM 9.1  AST 26  ALT 23  ALKPHOS 107  BILITOT 0.7    Cardiac Enzymes  Recent Labs Lab 05/25/15 0410 05/25/15 1401 05/25/15 2034  TROPONINI <0.03 <0.03 <0.03    Microbiology Results  @[email or web address] @  RADIOLOGY:  Dg Chest 2 View  05/25/2015  CLINICAL DATA:  Acute onset of shortness of breath, productive cough and fever. Chest pressure and difficulty breathing. Initial encounter. EXAM: CHEST  2 VIEW COMPARISON:  Chest radiograph performed 02/13/2007 FINDINGS: The lungs are well-aerated and clear. There is no evidence of focal opacification, pleural effusion or pneumothorax. The heart is normal in size; the mediastinal contour is within normal limits. No acute osseous abnormalities are seen. IMPRESSION: No acute cardiopulmonary process seen. Electronically Signed   By: Roanna Raider M.D.   On: 05/25/2015 01:15   Ct  Angio Chest Pe W/cm &/or Wo Cm  05/25/2015  CLINICAL DATA:  Acute onset of shortness of breath, productive cough and fever. Chest pressure and difficulty breathing. Initial encounter. EXAM: CT ANGIOGRAPHY CHEST WITH CONTRAST TECHNIQUE: Multidetector CT imaging of the chest was performed using the standard protocol during bolus administration of intravenous contrast. Multiplanar CT image reconstructions and MIPs were obtained to evaluate the vascular anatomy. CONTRAST:  75mL OMNIPAQUE IOHEXOL 350 MG/ML SOLN COMPARISON:  Chest radiograph from 05/25/2015 FINDINGS: There is no evidence of pulmonary embolus. The lungs are essentially clear bilaterally. There is no evidence of significant focal consolidation, pleural effusion or pneumothorax. No masses are identified; no abnormal focal contrast enhancement is seen. The mediastinum is unremarkable in appearance. No mediastinal lymphadenopathy is seen. No pericardial effusion is identified. The great vessels are grossly unremarkable in appearance. Mild coronary artery calcification is noted, about the proximal left anterior descending artery. No axillary lymphadenopathy is seen. The visualized portions of the thyroid gland are unremarkable in appearance. The visualized portions of the liver and spleen are unremarkable. Postoperative change is noted about the stomach. The visualized portions of the pancreas, adrenal glands and kidneys are grossly unremarkable. No acute osseous abnormalities are seen. Review of the MIP images confirms the above  findings. IMPRESSION: 1. No evidence of pulmonary embolus. 2. Lungs essentially clear bilaterally. 3. Mild coronary artery calcification noted, about the proximal left anterior descending artery. Electronically Signed   By: Roanna RaiderJeffery  Chang M.D.   On: 05/25/2015 03:18      Management plans discussed with the patient and she is in agreement. Stable for discharge   Patient should follow up with PCP in one week  CODE STATUS:      Code Status Orders        Start     Ordered   05/25/15 0816  Full code   Continuous     05/25/15 0815    Advance Directive Documentation        Most Recent Value   Type of Advance Directive  Healthcare Power of Attorney   Pre-existing out of facility DNR order (yellow form or pink MOST form)     "MOST" Form in Place?        TOTAL TIME TAKING CARE OF THIS PATIENT: 35 minutes.    Note: This dictation was prepared with Dragon dictation along with smaller phrase technology. Any transcriptional errors that result from this process are unintentional.  Ria Redcay M.D on 05/26/2015 at 11:14 AM  Between 7am to 6pm - Pager - [phone] After 6pm go to [email or web address] - password [email or web address]  [facility]  Office  [phone]  CC: Primary care physician; Rolm GalaGRANDIS, HEIDI, MD

## 2015-06-19 ENCOUNTER — Ambulatory Visit
Admission: RE | Admit: 2015-06-19 | Discharge: 2015-06-19 | Disposition: A | Discharge status: Home / Self Care | Payer: BLUE CROSS/BLUE SHIELD | Source: Ambulatory Visit | Attending: Obstetrics and Gynecology | Admitting: Obstetrics and Gynecology

## 2015-06-19 DIAGNOSIS — Z1231 Encounter for screening mammogram for malignant neoplasm of breast: Secondary | ICD-10-CM | POA: Diagnosis not present

## 2015-07-07 ENCOUNTER — Ambulatory Visit
Admission: RE | Admit: 2015-07-07 | Discharge: 2015-07-07 | Disposition: A | Discharge status: Home / Self Care | Payer: BLUE CROSS/BLUE SHIELD | Source: Ambulatory Visit | Attending: Obstetrics & Gynecology | Admitting: Obstetrics & Gynecology

## 2015-07-07 VITALS — BP 137/86 | HR 71 | Temp 98.3°F | Resp 18 | Ht 61.0 in | Wt 262.8 lb

## 2015-07-07 DIAGNOSIS — Z9884 Bariatric surgery status: Secondary | ICD-10-CM

## 2015-07-07 DIAGNOSIS — Z3169 Encounter for other general counseling and advice on procreation: Secondary | ICD-10-CM

## 2015-07-07 HISTORY — DX: Unspecified asthma, uncomplicated: J45.909

## 2015-07-07 HISTORY — DX: Polycystic ovarian syndrome: E28.2

## 2015-07-07 HISTORY — DX: Morbid (severe) obesity due to excess calories: E66.01

## 2015-07-07 NOTE — Progress Notes (Signed)
Maternal-Fetal Medicine Pre-conception Consultation: Angel Bowers is a 40 year-old G3 P0030 who presents for pre-conception consultation. She underwent a malabsorptive gastric bypass surgery at [facility] in March of 2016. Her pre-bariatric surgery weight was 360. Since then, she has lost 100 pounds and feels much better. Prior to her surgery she had about one period per year and now her cycles are occuring about every 8 weeks.  Her primary care physician is following her and had her on a micronutrient regimen that includes a multivitamin, folate, B12, biotin, calcium, and Vitamin K. In addition she is on protonix to protect from anastomotic ulcer.  She has been pregnant three times, the first two were chemical pregnancies in which she had a postiive pregnancy test immediately after a missed period and then had a heavy menses a few days later. She had one other first trimester miscarriage at about 6-[redacted] weeks gestation that did not require a D&C.  She has been with her partner for 26 years and he has not fathered a child. He had a semen analysis at age 51 due to medication exposure in his teens and this was reported to be normal.  Angel Bowers is exercising and feels well. She no longer has asthma or back pain. She does not have a hsitory of chronic hypertension or diabetes.    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 No tobacco, drug or ETOH use FH: Denies FH of birth defects, mental retardation or genetic disorders Meds: Multivitamin, Vit B12, Vit K, calcium, folate, biotin All: Sulfa ROS: No complaints.   Exam: Filed Vitals:   07/07/15 1100  BP: 137/86  Pulse: 71  Temp: 98.3 F (36.8 C)  Resp: 18  Ht 5 feet, 1 inch BMI 49  Labs (05/25/15): AST 26, ALT 23, Cr 0.75, Hct 43.7, Plt 208, MCV 89, TSH 2.067,  Glucose 100 Echo (05/25/15): Normal LVEF at 65-70%, mild LV diastolic dysfuction. No significant valvular disease CT chest (05/25/15): No PE, normal, incidental finding of small LAD calcification Mammo (06/19/15); Normal   Assessment and Recommendations: Angel Bowers is a 40 year-old G3 P0030 here for pre-conception consultation. She underwent a malabsorptive gastric bypass surgery in March of 2016. Since then she has lost 100 pounds and her current BMI is 49. She does not have chronic hypertension or diabetes and she is on an appropriate micronutrient supplementation regimen. Her cycles have improved but are still suggestive of irregular ovulation or anovulation as they are now every 8 weeks.  She is exercising and had a recent normal echo.  1. Obesity s/p malabsorptive gastric bypass surgery.  She is now almost 12 months out from her surgery and is doing well. She is on micronutrient supplementation and has no evidence of chronic hypertension or diabetes. We typically recommend waiting 12-24 months to attempt pregnancy following gastric bypass surgery but given her advanced maternal age, it is reasonable to attempt at 12 months. She is at risk for hypertensive disorders of pregnancy and gestational diabetes but is reassuring that she does not have type II diabetes or chronic hypertension now. She is at risk for abnormal labor and cesarean delivery and the associated risks of cesarean in setting of obesity.  Lastly, women are at risk for internal hernias during pregnancy so should she develop sudden onset of severe abdominal pain during pregnancy, she would  not seek immediate medical attention. She may require clomid therapy to initiate a cycle to ovulate. If clomid is not successful than she may need further evaluation by REI.   Continue current micronutrient regimen  2. AMA. We discussed the association of medical complications of pregnancy and fetal aneuploidy with AMA. When she gets pregnant,. we recommend  that we see her for genetic counseling to discuss aneuploidy testing options.  3. Three miscarriages. Though the first two were chemical pregnancies, we could initiate an APS evaluation with anti-cardiolipin antibody, anti-beta 2 glycoprotein antibody and lupus anticoagulant testing.  These can be sent at her primary Gyn office.  If she decides to pursue these tests and they are abnormal, we will be happy to see her back to discuss the implications on her pregnancy.  She states that she has had a normal pelvic ultrasound. If she ends up having another first trimester miscarriage, we would recommend proceeding with a sonohysterography to assess her cavity. I think we can hold off on that now as her first two pregnancies were chemical ones.  Luma Clopper, [place] A, MD

## 2017-10-15 ENCOUNTER — Ambulatory Visit
Admission: EM | Admit: 2017-10-15 | Discharge: 2017-10-15 | Disposition: A | Discharge status: Home / Self Care | Payer: BLUE CROSS/BLUE SHIELD | Attending: Family Medicine | Admitting: Family Medicine

## 2017-10-15 ENCOUNTER — Other Ambulatory Visit: Payer: Self-pay

## 2017-10-15 DIAGNOSIS — L0202 Furuncle of face: Secondary | ICD-10-CM

## 2017-10-15 DIAGNOSIS — L03211 Cellulitis of face: Secondary | ICD-10-CM

## 2017-10-15 MED ORDER — DOXYCYCLINE HYCLATE 100 MG PO TABS: 100.0000 mg | ORAL_TABLET | Freq: Two times a day (BID) | ORAL | 0 refills | Status: AC

## 2017-10-15 NOTE — ED Triage Notes (Signed)
Patient complains of a rash that started on her face yesterday as just a small spot. Patient has left sided facial swelling with warm to the touch sensation.

## 2017-10-15 NOTE — ED Provider Notes (Signed)
[facility]    CSN: [id number] Arrival date & time: 10/15/17  4098     History   Chief Complaint Chief Complaint  Patient presents with  . Rash    HPI Angel Bowers is a 42 y.o. female.   42 yo female with a c/o painful, tenderness and redness to left cheek since yesterday. States she's had a "blemish" for a couple of days as well. Denies any fevers, chills, or drainage.  The history is provided by the patient.  Rash    Past Medical History:  Diagnosis Date  . Asthma   . Morbid obesity (HCC)   . PCOS (polycystic ovarian syndrome) 1990s    Patient Active Problem List   Diagnosis Date Noted  . Encounter for preconception consultation 07/07/2015  . Morbid obesity (HCC) 07/07/2015  . H/O bariatric surgery 07/07/2015  . Dyspnea 05/25/2015    Past Surgical History:  Procedure Laterality Date  . LAPAROSCOPIC GASTRIC RESTRICTIVE DUODENAL PROCEDURE (DUODENAL SWITCH)      OB History    Gravida  3   Para      Term      Preterm      AB  1   Living        SAB  1   TAB      Ectopic      Multiple      Live Births               Home Medications    Prior to Admission medications   Medication Sig Start Date End Date Taking? Authorizing Provider  Biotin 10 MG TABS Take 10,000 mg by mouth daily.    Yes [provider]  Calcium Citrate 200 MG TABS Take 1,500 mg by mouth daily.    Yes [provider]  Cholecalciferol (VITAMIN D-1000 MAX ST) 1000 units tablet Take 6,000 Units by mouth daily.   Yes [provider]  Cyanocobalamin (B-12) 2500 MCG TABS Take 2,000 mcg by mouth daily.    Yes [provider]  folic acid (FOLVITE) 1 MG tablet Take 1 mg by mouth daily.   Yes [provider]  Multiple Vitamin (MULTIVITAMIN) tablet Take 2 tablets by mouth daily.   Yes [provider]  pantoprazole (PROTONIX) 40 MG tablet Take 40 mg by mouth daily.   Yes [provider]  vitamin A ([employer]  VITAMIN A) 10000 UNIT capsule Take 10,000 Units by mouth daily.   Yes [provider]  VITAMIN K PO Take by mouth.   Yes [provider]  azithromycin (ZITHROMAX) 250 MG tablet 1 tablet daily for 3 days Patient not taking: Reported on 07/07/2015 05/26/15   Adrian Saran, MD  doxycycline (VIBRA-TABS) 100 MG tablet Take 1 tablet (100 mg total) by mouth 2 (two) times daily. 10/15/17   Payton Mccallum, MD  Phytonadione 100 MCG TABS Take 500 mcg by mouth daily.    [provider]  predniSONE (DELTASONE) 10 MG tablet Take 1 tablet (10 mg total) by mouth daily with breakfast. Patient not taking: Reported on 07/07/2015 05/26/15   Adrian Saran, MD    Family History Family History  Problem Relation Age of Onset  . Heart disease Mother   . COPD Father   . Diabetes Father   . Breast cancer Neg Hx     Social History Social History   Tobacco Use  . Smoking status: Never Smoker  . Smokeless tobacco: Never Used  Substance Use Topics  .  Alcohol use: No  . Drug use: No     Allergies   Sulfa antibiotics   Review of Systems Review of Systems  Skin: Positive for rash.     Physical Exam Triage Vital Signs ED Triage Vitals  Enc Vitals Group     BP 10/15/17 0906 (!) 144/93     Pulse Rate 10/15/17 0906 90     Resp 10/15/17 0906 18     Temp 10/15/17 0906 98.7 F (37.1 C)     Temp Source 10/15/17 0906 Oral     SpO2 10/15/17 0906 100 %     Weight 10/15/17 0902 250 lb (113.4 kg)     Height 10/15/17 0902  (1.575 m)     Head Circumference --      Peak Flow --      Pain Score 10/15/17 0902 1     Pain Loc --      Pain Edu? --      Excl. in GC? --    No data found.  Updated Vital Signs BP (!) 144/93 (BP Location: Left Arm)   Pulse 90   Temp 98.7 F (37.1 C) (Oral)   Resp 18   Ht  (1.575 m)   Wt 250 lb (113.4 kg)   LMP 10/08/2017   SpO2 100%   BMI 45.73 kg/m   Visual Acuity Right Eye Distance:   Left Eye Distance:   Bilateral Distance:    Right  Eye Near:   Left Eye Near:    Bilateral Near:     Physical Exam  Constitutional: She appears well-developed and well-nourished. No distress.  Skin: She is not diaphoretic. There is erythema.  approx 1cm tender, indurated, erythematous skin lesion on left lower cheek with surrounding blanchable erythema, warmth and tenderness to palpation; no drainage or fluctuance  Vitals reviewed.    UC Treatments / Results  Labs (all labs ordered are listed, but only abnormal results are displayed) Labs Reviewed - No data to display  EKG None  Radiology No results found.  Procedures Procedures (including critical care time)  Medications Ordered in UC Medications - No data to display  Initial Impression / Assessment and Plan / UC Course  I have reviewed the triage vital signs and the nursing notes.  Pertinent labs & imaging results that were available during my care of the patient were reviewed by me and considered in my medical decision making (see chart for details).      Final Clinical Impressions(s) / UC Diagnoses   Final diagnoses:  Facial cellulitis  Boil, face   Discharge Instructions   None    ED Prescriptions    Medication Sig Dispense Auth. Provider   doxycycline (VIBRA-TABS) 100 MG tablet Take 1 tablet (100 mg total) by mouth 2 (two) times daily. 20 tablet Payton Mccallum, MD      1. diagnosis reviewed with patient 2. rx as per orders above; reviewed possible side effects, interactions, risks and benefits  3. Recommend supportive treatment with warm compresses to area 4. Follow-up prn if symptoms worsen or don't improve   Controlled Substance Prescriptions [place] Controlled Substance Registry consulted? Not Applicable   Payton Mccallum, MD 10/15/17 [phone]

## 2017-12-15 IMAGING — CT CT ANGIO CHEST
2 of 6 series · 18 of 46 positions shown · IV contrast (APPLIED)
Comparison: Chest radiograph from 05/25/2015

CLINICAL DATA: Acute onset of shortness of breath, productive cough
and fever. Chest pressure and difficulty breathing. Initial
encounter.

EXAM:
CT ANGIOGRAPHY CHEST WITH CONTRAST
TECHNIQUE: Multidetector CT imaging of the chest was performed using the
standard protocol during bolus administration of intravenous
contrast. Multiplanar CT image reconstructions and MIPs were
obtained to evaluate the vascular anatomy.
CONTRAST:  75mL OMNIPAQUE IOHEXOL 350 MG/ML SOLN

[Series 5: pe thins 1.5 · axial · 0.68mm/px · z∈[-639,-398]mm · 15 of 225 slices shown]
[im 12/225  lung]
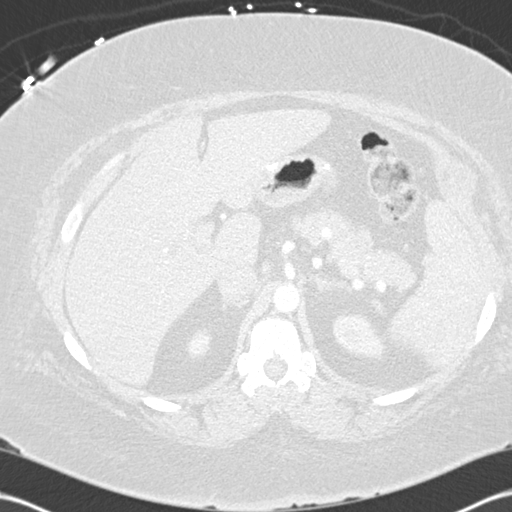
[im 24/225  soft-tissue]
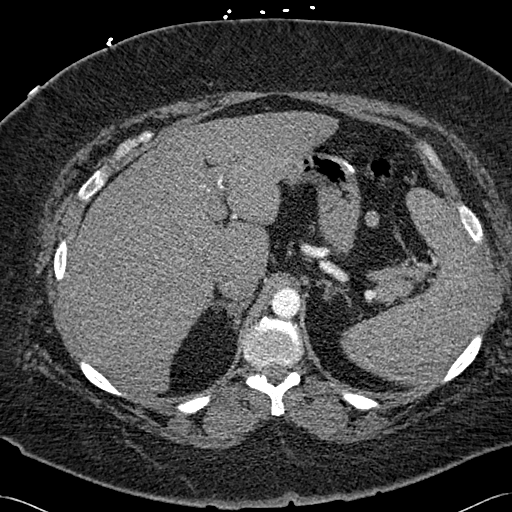
[im 48/225  lung]
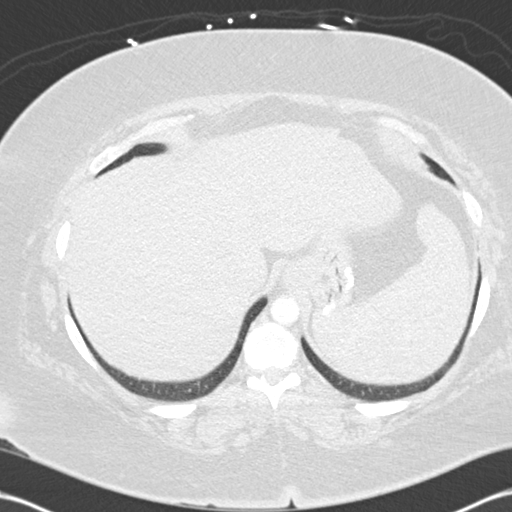
[im 59/225  soft-tissue]
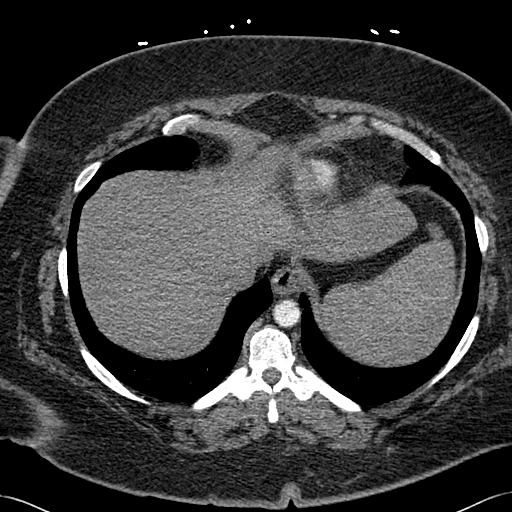
[im 71/225  lung]
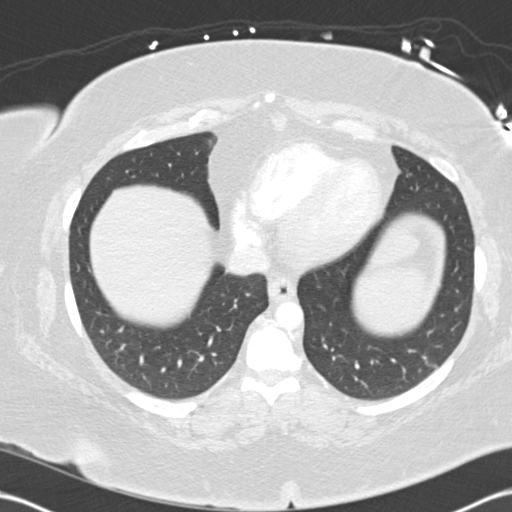
[im 83/225  soft-tissue]
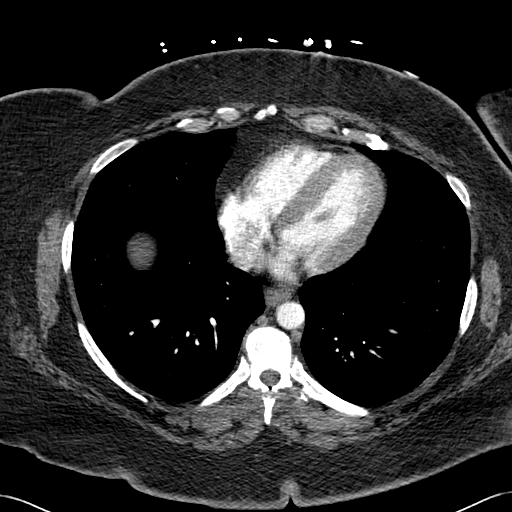
[im 95/225  lung]
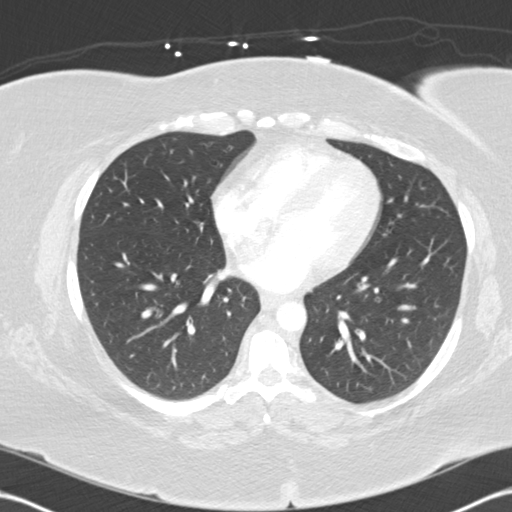
[im 118/225  soft-tissue]
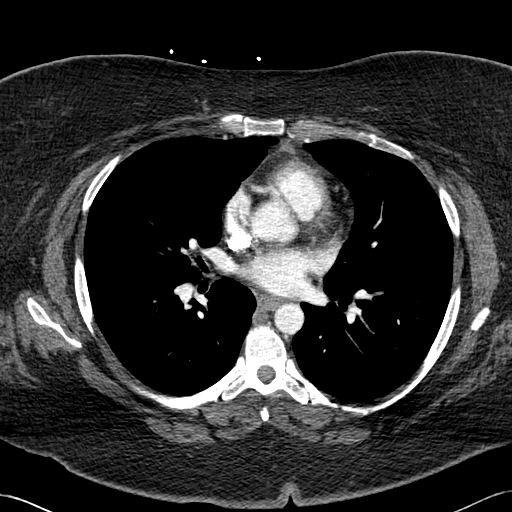
[im 130/225  lung]
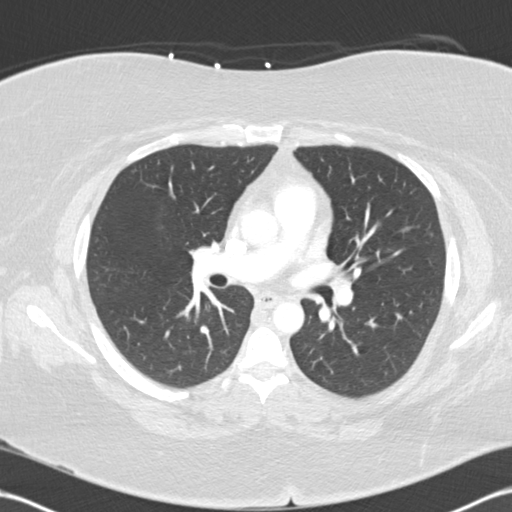
[im 142/225  soft-tissue]
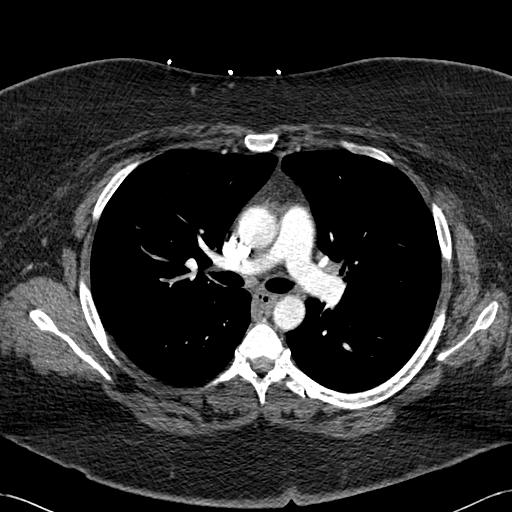
[im 154/225  lung]
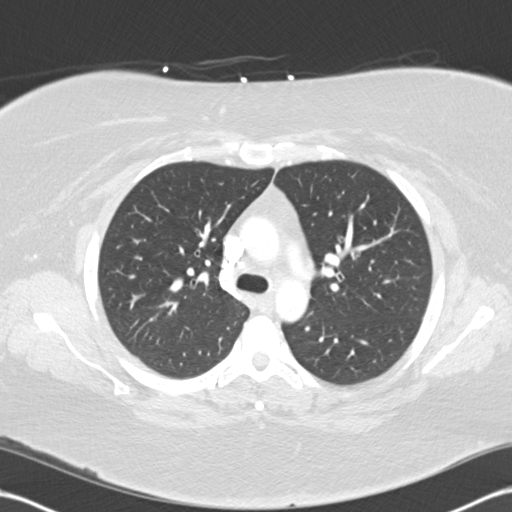
[im 166/225  soft-tissue]
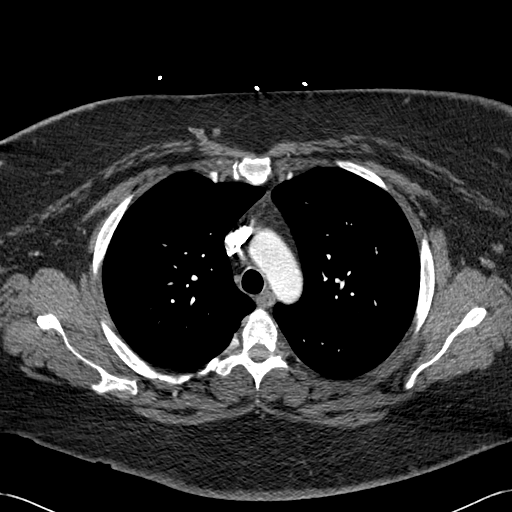
[im 189/225  lung]
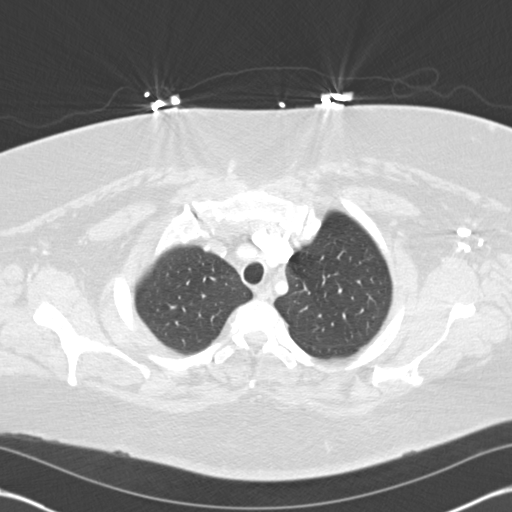
[im 201/225  soft-tissue]
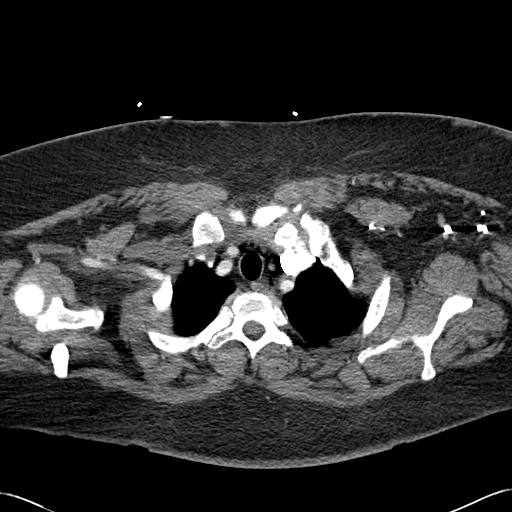
[im 213/225  lung]
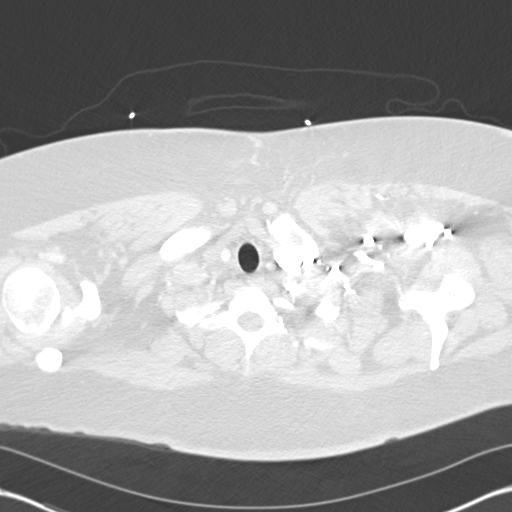

[Series 7: cor mpr 2.0 · coronal · 0.54mm/px · 3 of 151 slices shown]
[im 38/151  soft-tissue]
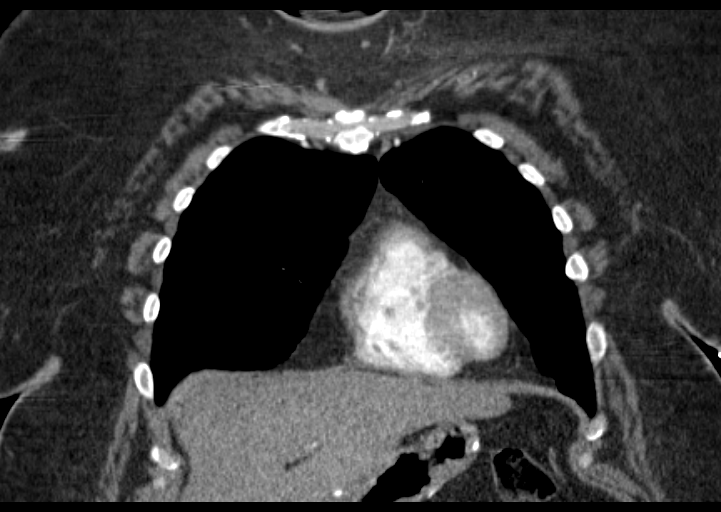
[im 76/151  soft-tissue]
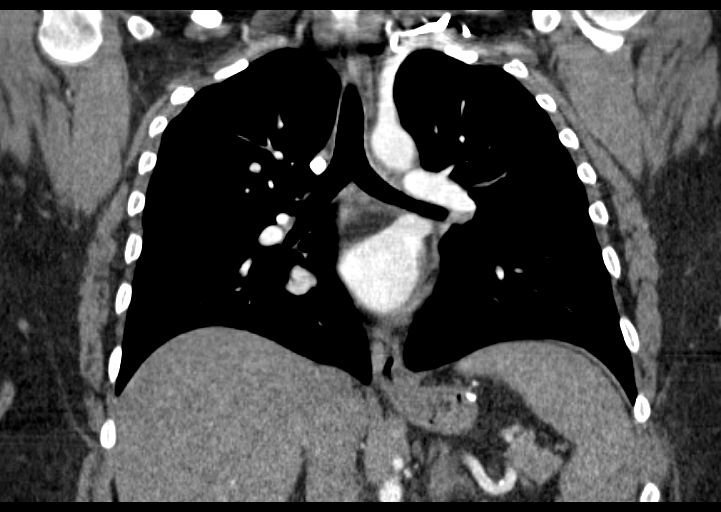
[im 113/151  soft-tissue]
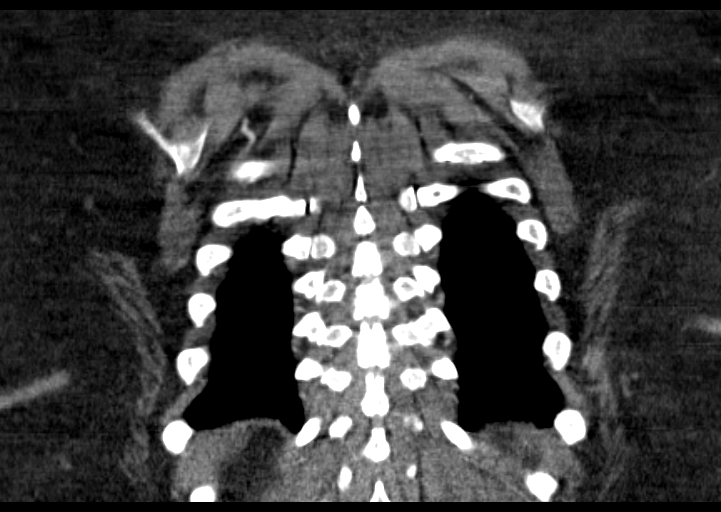

[18 of 46 positions shown; findings below may reference images not displayed]

FINDINGS: There is no evidence of pulmonary embolus.

The lungs are essentially clear bilaterally. There is no evidence of
significant focal consolidation, pleural effusion or pneumothorax.
No masses are identified; no abnormal focal contrast enhancement is
seen.

The mediastinum is unremarkable in appearance. No mediastinal
lymphadenopathy is seen. No pericardial effusion is identified. The
great vessels are grossly unremarkable in appearance. Mild coronary
artery calcification is noted, about the proximal left anterior
descending artery. No axillary lymphadenopathy is seen. The
visualized portions of the thyroid gland are unremarkable in
appearance.

The visualized portions of the liver and spleen are unremarkable.
Postoperative change is noted about the stomach. The visualized
portions of the pancreas, adrenal glands and kidneys are grossly
unremarkable.

No acute osseous abnormalities are seen.

Review of the MIP images confirms the above findings.
IMPRESSION: 1. No evidence of pulmonary embolus.
2. Lungs essentially clear bilaterally.
3. Mild coronary artery calcification noted, about the proximal left
anterior descending artery.
